# Patient Record
Sex: Female | Born: 1945 | Hispanic: No | Marital: Married | Smoking: Never smoker
Health system: Southern US, Community
[De-identification: ages and names within clinical notes are randomized; demographics above are authoritative.]

## PROBLEM LIST (undated history)

## (undated) DIAGNOSIS — G709 Myoneural disorder, unspecified: Secondary | ICD-10-CM

## (undated) HISTORY — PX: COSMETIC SURGERY: SHX468

## (undated) HISTORY — DX: Myoneural disorder, unspecified: G70.9

---

## 1999-07-07 ENCOUNTER — Other Ambulatory Visit: Admission: RE | Admit: 1999-07-07 | Discharge: 1999-07-07 | Payer: Self-pay | Admitting: Obstetrics and Gynecology

## 2002-04-01 ENCOUNTER — Other Ambulatory Visit: Admission: RE | Admit: 2002-04-01 | Discharge: 2002-04-01 | Payer: Self-pay | Admitting: Gynecology

## 2005-06-06 ENCOUNTER — Other Ambulatory Visit: Admission: RE | Admit: 2005-06-06 | Discharge: 2005-06-06 | Payer: Self-pay | Admitting: Gynecology

## 2007-07-02 ENCOUNTER — Ambulatory Visit: Payer: Self-pay | Admitting: Family Medicine

## 2007-07-02 LAB — CONVERTED CEMR LAB
ALT: 22 units/L (ref 0–35)
AST: 24 units/L (ref 0–37)
Alkaline Phosphatase: 50 units/L (ref 39–117)
Basophils Absolute: 0 10*3/uL (ref 0.0–0.1)
Basophils Relative: 1 % (ref 0–1)
CO2: 27 meq/L (ref 19–32)
Eosinophils Absolute: 0.1 10*3/uL (ref 0.0–0.7)
Eosinophils Relative: 2 % (ref 0–5)
HCT: 40.5 % (ref 36.0–46.0)
Helicobacter Pylori Antibody-IgG: 0.7
MCHC: 32.3 g/dL (ref 30.0–36.0)
MCV: 78 fL (ref 78.0–100.0)
Monocytes Relative: 8 % (ref 3–11)
RBC: 5.19 M/uL — ABNORMAL HIGH (ref 3.87–5.11)
Sodium: 142 meq/L (ref 135–145)
Total Bilirubin: 0.6 mg/dL (ref 0.3–1.2)
Total Protein: 7.5 g/dL (ref 6.0–8.3)
WBC: 6.9 10*3/uL (ref 4.0–10.5)

## 2007-07-03 ENCOUNTER — Ambulatory Visit: Payer: Self-pay | Admitting: *Deleted

## 2007-07-08 ENCOUNTER — Ambulatory Visit (HOSPITAL_COMMUNITY): Admission: RE | Admit: 2007-07-08 | Discharge: 2007-07-08 | Payer: Self-pay | Admitting: Family Medicine

## 2007-08-12 ENCOUNTER — Ambulatory Visit: Payer: Self-pay | Admitting: Family Medicine

## 2007-09-19 ENCOUNTER — Ambulatory Visit: Payer: Self-pay | Admitting: Family Medicine

## 2008-01-02 ENCOUNTER — Ambulatory Visit: Payer: Self-pay | Admitting: Internal Medicine

## 2008-05-13 ENCOUNTER — Ambulatory Visit: Payer: Self-pay | Admitting: Family Medicine

## 2008-06-02 ENCOUNTER — Ambulatory Visit: Payer: Self-pay | Admitting: Internal Medicine

## 2008-06-11 ENCOUNTER — Encounter (INDEPENDENT_AMBULATORY_CARE_PROVIDER_SITE_OTHER): Payer: Self-pay | Admitting: Family Medicine

## 2008-06-11 ENCOUNTER — Ambulatory Visit: Payer: Self-pay | Admitting: Family Medicine

## 2008-06-11 LAB — CONVERTED CEMR LAB
BUN: 14 mg/dL (ref 6–23)
Basophils Relative: 0 % (ref 0–1)
Chloride: 105 meq/L (ref 96–112)
Eosinophils Absolute: 0.1 10*3/uL (ref 0.0–0.7)
Eosinophils Relative: 1 % (ref 0–5)
Free T4: 1.3 ng/dL (ref 0.89–1.80)
HCT: 38.1 % (ref 36.0–46.0)
Lymphocytes Relative: 31 % (ref 12–46)
MCV: 75.6 fL — ABNORMAL LOW (ref 78.0–100.0)
Neutro Abs: 4.1 10*3/uL (ref 1.7–7.7)
Platelets: 247 10*3/uL (ref 150–400)
Potassium: 4.3 meq/L (ref 3.5–5.3)
Sodium: 141 meq/L (ref 135–145)
WBC: 6.9 10*3/uL (ref 4.0–10.5)

## 2008-06-19 ENCOUNTER — Ambulatory Visit (HOSPITAL_COMMUNITY): Admission: RE | Admit: 2008-06-19 | Discharge: 2008-06-19 | Payer: Self-pay | Admitting: Family Medicine

## 2008-07-10 ENCOUNTER — Ambulatory Visit: Payer: Self-pay | Admitting: Family Medicine

## 2008-10-14 ENCOUNTER — Ambulatory Visit: Payer: Self-pay | Admitting: Family Medicine

## 2008-10-14 LAB — CONVERTED CEMR LAB: Helicobacter Pylori Antibody-IgG: 0.6

## 2008-11-17 ENCOUNTER — Ambulatory Visit: Payer: Self-pay | Admitting: Family Medicine

## 2009-02-12 ENCOUNTER — Ambulatory Visit: Payer: Self-pay | Admitting: Family Medicine

## 2009-02-23 ENCOUNTER — Ambulatory Visit (HOSPITAL_COMMUNITY): Admission: RE | Admit: 2009-02-23 | Discharge: 2009-02-23 | Payer: Self-pay | Admitting: Family Medicine

## 2009-04-13 ENCOUNTER — Ambulatory Visit: Payer: Self-pay | Admitting: Family Medicine

## 2009-10-01 ENCOUNTER — Ambulatory Visit: Payer: Self-pay | Admitting: Family Medicine

## 2009-10-12 ENCOUNTER — Ambulatory Visit (HOSPITAL_COMMUNITY): Admission: RE | Admit: 2009-10-12 | Discharge: 2009-10-12 | Payer: Self-pay | Admitting: Family Medicine

## 2010-03-22 ENCOUNTER — Ambulatory Visit: Payer: Self-pay | Admitting: Family Medicine

## 2010-04-04 ENCOUNTER — Ambulatory Visit (HOSPITAL_COMMUNITY): Admission: RE | Admit: 2010-04-04 | Discharge: 2010-04-04 | Payer: Self-pay | Admitting: Family Medicine

## 2010-11-11 ENCOUNTER — Other Ambulatory Visit (HOSPITAL_COMMUNITY): Payer: Self-pay | Admitting: Family Medicine

## 2010-11-11 DIAGNOSIS — Z1231 Encounter for screening mammogram for malignant neoplasm of breast: Secondary | ICD-10-CM

## 2010-11-18 ENCOUNTER — Ambulatory Visit (HOSPITAL_COMMUNITY): Payer: Self-pay

## 2010-11-21 ENCOUNTER — Ambulatory Visit (HOSPITAL_COMMUNITY)
Admission: RE | Admit: 2010-11-21 | Discharge: 2010-11-21 | Disposition: A | Discharge status: Home / Self Care | Payer: Self-pay | Source: Ambulatory Visit | Attending: Family Medicine | Admitting: Family Medicine

## 2010-11-21 DIAGNOSIS — Z1231 Encounter for screening mammogram for malignant neoplasm of breast: Secondary | ICD-10-CM | POA: Insufficient documentation

## 2011-12-22 DIAGNOSIS — E039 Hypothyroidism, unspecified: Secondary | ICD-10-CM | POA: Diagnosis not present

## 2011-12-22 DIAGNOSIS — M47812 Spondylosis without myelopathy or radiculopathy, cervical region: Secondary | ICD-10-CM | POA: Diagnosis not present

## 2011-12-22 DIAGNOSIS — R059 Cough, unspecified: Secondary | ICD-10-CM | POA: Diagnosis not present

## 2011-12-22 DIAGNOSIS — M129 Arthropathy, unspecified: Secondary | ICD-10-CM | POA: Diagnosis not present

## 2011-12-22 DIAGNOSIS — M542 Cervicalgia: Secondary | ICD-10-CM | POA: Diagnosis not present

## 2011-12-22 DIAGNOSIS — R1084 Generalized abdominal pain: Secondary | ICD-10-CM | POA: Diagnosis not present

## 2011-12-22 DIAGNOSIS — N951 Menopausal and female climacteric states: Secondary | ICD-10-CM | POA: Diagnosis not present

## 2013-04-29 ENCOUNTER — Ambulatory Visit (INDEPENDENT_AMBULATORY_CARE_PROVIDER_SITE_OTHER): Payer: Medicare Other | Admitting: Family Medicine

## 2013-04-29 ENCOUNTER — Ambulatory Visit: Payer: Medicare Other

## 2013-04-29 VITALS — BP 100/60 | HR 70 | Temp 97.8°F | Resp 18 | Ht 60.0 in | Wt 99.0 lb

## 2013-04-29 DIAGNOSIS — R5381 Other malaise: Secondary | ICD-10-CM

## 2013-04-29 DIAGNOSIS — Z789 Other specified health status: Secondary | ICD-10-CM

## 2013-04-29 DIAGNOSIS — K219 Gastro-esophageal reflux disease without esophagitis: Secondary | ICD-10-CM

## 2013-04-29 DIAGNOSIS — R079 Chest pain, unspecified: Secondary | ICD-10-CM

## 2013-04-29 DIAGNOSIS — G8929 Other chronic pain: Secondary | ICD-10-CM

## 2013-04-29 DIAGNOSIS — I499 Cardiac arrhythmia, unspecified: Secondary | ICD-10-CM

## 2013-04-29 DIAGNOSIS — R1013 Epigastric pain: Secondary | ICD-10-CM

## 2013-04-29 LAB — POCT CBC
HCT, POC: 39.4 % (ref 37.7–47.9)
Lymph, poc: 2 (ref 0.6–3.4)
MCH, POC: 25.4 pg — AB (ref 27–31.2)
MCHC: 31.5 g/dL — AB (ref 31.8–35.4)
MCV: 80.8 fL (ref 80–97)
MPV: 8.7 fL (ref 0–99.8)
POC LYMPH PERCENT: 29.3 %L (ref 10–50)
Platelet Count, POC: 223 10*3/uL (ref 142–424)

## 2013-04-29 MED ORDER — OMEPRAZOLE 20 MG PO CPDR: 20.0000 mg | DELAYED_RELEASE_CAPSULE | Freq: Every day | ORAL | Status: DC

## 2013-04-29 MED ORDER — SUCRALFATE 1 G PO TABS: 1.0000 g | ORAL_TABLET | Freq: Four times a day (QID) | ORAL | Status: DC

## 2013-04-29 NOTE — Patient Instructions (Addendum)
Use the carafate and prilosec as directed for heartburn/ stomach pain.   We are going to refer you to cardiology for further evaluation of your heart.   You will need to bring someone who speaks English to your cardiology appointment please.

## 2013-04-29 NOTE — Progress Notes (Addendum)
[facility] [address], [place] [address] [phone]  Date:  04/29/2013   Name:  Diana Mercer   DOB:  [birth year]   MRN:  [id number]  PCP:  No primary provider on file.    Chief Complaint: Abdominal Pain and Chest Pain   History of Present Illness:  Diana Mercer is a 67 y.o. very pleasant female patient who presents with the following:  Here as a new patient today.  She does have a doctor at [facility] [place].  She was seen there recently and received a refill of her hormone patch.  There is a significant language barrier with the pt and her husband; I am not able to communicate clearly.   Spoke with her daughter Diana Mercer over the phone and she interpreted between [place].  Pt has complaint of feeling tired, she "yawns a lot" sometimes.   She has compliant of stomach pain on and off.  Her pain will wax and wane, sometimes go away totally.  This has gone on for "many years."   No nausea or vomiting. She states she feels SOB when she is anxious or startled.  Gives an example of if someone came up behind her suddenly she would feel surprised and SOB.  States she does not actually have CP, but sometimes will feel hot and uncomfortable.  She also sometimes has burning in her stomach- ?like GERD.  Denies any current CP.    She has been seen by a few different doctors at unknown locations and tried a few different medications (also unknown) for her abdominal issues.  Her daughter states that "she cannot take most medicines, they are too strong for her" but is not sure of any specific adverse reactions in the past.    Noted frequent PVCs on her EKG.  She gives a vague history of occasional palpitations which are not bothersome to her.  Cannot state how often or for how long she has noted palpitations.     There are no active problems to display for this patient.   Past Medical History  Diagnosis Date  . Neuromuscular disorder     Past Surgical  History  Procedure Laterality Date  . Cosmetic surgery      History  Substance Use Topics  . Smoking status: Never Smoker   . Smokeless tobacco: Not on file  . Alcohol Use: No    History reviewed. No pertinent family history.  Allergies  Allergen Reactions  . Tylenol [Acetaminophen]     No reaction pt states that she feels it is not good for her    Medication list has been reviewed and updated.  No current outpatient prescriptions on file prior to visit.   No current facility-administered medications on file prior to visit.    Review of Systems:  As per HPI- otherwise negative.   Physical Examination: Filed Vitals:   04/29/13 1037  BP: 100/60  Pulse: 70  Temp: 97.8 F (36.6 C)  Resp: 18   Filed Vitals:   04/29/13 1037  Height: 5' (1.524 m)  Weight: 99 lb (44.906 kg)   Body mass index is 19.33 kg/(m^2). Ideal Body Weight: Weight in (lb) to have BMI = 25: 127.7  GEN: WDWN, NAD, Non-toxic, A & O x 3, slim build HEENT: Atraumatic, Normocephalic. Neck supple. No masses, No LAD. Ears and Nose: No external deformity. CV: RRR with early beats, No M/G/R. No JVD. No thrill. No extra heart sounds. PULM: CTA B, no  wheezes, crackles, rhonchi. No retractions. No resp. distress. No accessory muscle use. ABD: S, NT, ND, +BS. No rebound. No HSM. EXTR: No c/c/e NEURO Normal gait.  PSYCH: Normally interactive. Conversant. Not depressed or anxious appearing.  Calm demeanor.   EKG:  Frequent PVCs.   Down going T in V2- otherwise no ST abnormality.    [facility] reading (PRIMARY) by  Dr. Patsy Lager. Abdominal series:  Non- obstructive, non- specific bowel gas pattern.  Negative AP chest  ACUTE ABDOMEN SERIES (ABDOMEN 2 VIEW & CHEST 1 VIEW)  Comparison: October 12, 2009, February 23, 2009  Findings: there is asymmetric opacity identified in the lateral left apex. There is no pulmonary edema or pleural effusion. There is no bowel obstruction or free air. Bowel content is  noted throughout colon. There is scoliosis of spine.  IMPRESSION: No bowel obstruction. Constipation. Asymmetric opacity identified in the lateral left apex. If the patient has prior chest x-ray for comparison comparison would be helpful to establish stability. If no prior chest x-rays are available, recommend further evaluation with chest CT.  Clinically significant discrepancy from primary report, if provided: None  Results for orders placed in visit on 04/29/13  POCT CBC      Result Value Range   WBC 6.9  4.6 - 10.2 K/uL   Lymph, poc 2.0  0.6 - 3.4   POC LYMPH PERCENT 29.3  10 - 50 %L   MID (cbc) 0.5  0 - 0.9   POC MID % 6.7  0 - 12 %M   POC Granulocyte 4.4  2 - 6.9   Granulocyte percent 64.0  37 - 80 %G   RBC 4.88  4.04 - 5.48 M/uL   Hemoglobin 12.4  12.2 - 16.2 g/dL   HCT, POC 40.9  81.1 - 47.9 %   MCV 80.8  80 - 97 fL   MCH, POC 25.4 (*) 27 - 31.2 pg   MCHC 31.5 (*) 31.8 - 35.4 g/dL   RDW, POC 91.4     Platelet Count, POC 223  142 - 424 K/uL   MPV 8.7  0 - 99.8 fL    Assessment and Plan: Language barrier  Other malaise and fatigue  Abdominal pain, chronic, epigastric - Plan: POCT CBC, Comprehensive metabolic panel, DG Abd Acute W/Chest  Chest pain - Plan: EKG 12-Lead, DG Abd Acute W/Chest  Irregular heart beat - Plan: Ambulatory referral to Cardiology, TSH  GERD (gastroesophageal reflux disease) - Plan: sucralfate (CARAFATE) 1 G tablet, omeprazole (PRILOSEC) 20 MG capsule  Diana Mercer is here today with history of several years of abdominal pain, and also fatigue.  Communicated with her by speaking with her daughter over the phone.  This required extra time for our visit.  GERD is the most likely diagnosis.  Will start her on carafate and prilosec while we await her CMP.    She does have frequent PVCs but no acute changes on today's EKG.  Denies CP.  Will refer to cardiology- have instructed her that it would be best if she can bring a family member who speaks  English to this appt.    If anything changes or gets worse she is to seek care.    Signed Abbe Amsterdam, MD  Received her CXR report.  Will follow- up with the rest of her labs.   8/27- called but was not able to communicate with person who answered phone.  Will send letter to pt- see copy of letter.  Also hand wrote "Please call  me to talk about your chest x-ray" on her letter.

## 2013-04-30 ENCOUNTER — Encounter: Payer: Self-pay | Admitting: Family Medicine

## 2013-04-30 LAB — COMPREHENSIVE METABOLIC PANEL
Albumin: 4.4 g/dL (ref 3.5–5.2)
Alkaline Phosphatase: 56 U/L (ref 39–117)
Calcium: 9.3 mg/dL (ref 8.4–10.5)

## 2013-04-30 LAB — TSH: TSH: 1.342 u[IU]/mL (ref 0.350–4.500)

## 2013-05-20 ENCOUNTER — Other Ambulatory Visit: Payer: Self-pay | Admitting: Family Medicine

## 2013-05-20 DIAGNOSIS — K219 Gastro-esophageal reflux disease without esophagitis: Secondary | ICD-10-CM

## 2013-05-21 NOTE — Telephone Encounter (Signed)
Dr Patsy Lager, do you want to keep Rxing this for pt to take regularly?

## 2013-09-19 ENCOUNTER — Ambulatory Visit (INDEPENDENT_AMBULATORY_CARE_PROVIDER_SITE_OTHER): Payer: Medicare Other | Admitting: Emergency Medicine

## 2013-09-19 VITALS — BP 126/60 | HR 72 | Temp 98.1°F | Resp 16 | Ht 60.0 in | Wt 99.0 lb

## 2013-09-19 DIAGNOSIS — M5430 Sciatica, unspecified side: Secondary | ICD-10-CM

## 2013-09-19 MED ORDER — TRAMADOL HCL 50 MG PO TABS: 50.0000 mg | ORAL_TABLET | Freq: Three times a day (TID) | ORAL | Status: DC | PRN

## 2013-09-19 NOTE — Patient Instructions (Signed)
Sciatica °Sciatica is pain, weakness, numbness, or tingling along the path of the sciatic nerve. The nerve starts in the lower back and runs down the back of each leg. The nerve controls the muscles in the lower leg and in the back of the knee, while also providing sensation to the back of the thigh, lower leg, and the sole of your foot. Sciatica is a symptom of another medical condition. For instance, nerve damage or certain conditions, such as a herniated disk or bone spur on the spine, pinch or put pressure on the sciatic nerve. This causes the pain, weakness, or other sensations normally associated with sciatica. Generally, sciatica only affects one side of the body. °CAUSES  °· Herniated or slipped disc. °· Degenerative disk disease. °· A pain disorder involving the narrow muscle in the buttocks (piriformis syndrome). °· Pelvic injury or fracture. °· Pregnancy. °· Tumor (rare). °SYMPTOMS  °Symptoms can vary from mild to very severe. The symptoms usually travel from the low back to the buttocks and down the back of the leg. Symptoms can include: °· Mild tingling or dull aches in the lower back, leg, or hip. °· Numbness in the back of the calf or sole of the foot. °· Burning sensations in the lower back, leg, or hip. °· Sharp pains in the lower back, leg, or hip. °· Leg weakness. °· Severe back pain inhibiting movement. °These symptoms may get worse with coughing, sneezing, laughing, or prolonged sitting or standing. Also, being overweight may worsen symptoms. °DIAGNOSIS  °Your caregiver will perform a physical exam to look for common symptoms of sciatica. He or she may ask you to do certain movements or activities that would trigger sciatic nerve pain. Other tests may be performed to find the cause of the sciatica. These may include: °· Blood tests. °· X-rays. °· Imaging tests, such as an MRI or CT scan. °TREATMENT  °Treatment is directed at the cause of the sciatic pain. Sometimes, treatment is not necessary  and the pain and discomfort goes away on its own. If treatment is needed, your caregiver may suggest: °· Over-the-counter medicines to relieve pain. °· Prescription medicines, such as anti-inflammatory medicine, muscle relaxants, or narcotics. °· Applying heat or ice to the painful area. °· Steroid injections to lessen pain, irritation, and inflammation around the nerve. °· Reducing activity during periods of pain. °· Exercising and stretching to strengthen your abdomen and improve flexibility of your spine. Your caregiver may suggest losing weight if the extra weight makes the back pain worse. °· Physical therapy. °· Surgery to eliminate what is pressing or pinching the nerve, such as a bone spur or part of a herniated disk. °HOME CARE INSTRUCTIONS  °· Only take over-the-counter or prescription medicines for pain or discomfort as directed by your caregiver. °· Apply ice to the affected area for 20 minutes, 3 4 times a day for the first 48 72 hours. Then try heat in the same way. °· Exercise, stretch, or perform your usual activities if these do not aggravate your pain. °· Attend physical therapy sessions as directed by your caregiver. °· Keep all follow-up appointments as directed by your caregiver. °· Do not wear high heels or shoes that do not provide proper support. °· Check your mattress to see if it is too soft. A firm mattress may lessen your pain and discomfort. °SEEK IMMEDIATE MEDICAL CARE IF:  °· You lose control of your bowel or bladder (incontinence). °· You have increasing weakness in the lower back,   pelvis, buttocks, or legs. °· You have redness or swelling of your back. °· You have a burning sensation when you urinate. °· You have pain that gets worse when you lie down or awakens you at night. °· Your pain is worse than you have experienced in the past. °· Your pain is lasting longer than 4 weeks. °· You are suddenly losing weight without reason. °MAKE SURE YOU: °· Understand these  instructions. °· Will watch your condition. °· Will get help right away if you are not doing well or get worse. °Document Released: 08/15/2001 Document Revised: 02/20/2012 Document Reviewed: 12/31/2011 °ExitCare® Patient Information ©2014 [employer]. ° °

## 2013-09-19 NOTE — Progress Notes (Signed)
[facility] [address], [place] [id number] [phone]  Date:  09/19/2013   Name:  Diana Mercer   DOB:  02/08/1946   MRN:  [id number]  PCP:  No primary provider on file.    Chief Complaint: Leg Pain   History of Present Illness:  Diana Mercer is a 68 y.o. very pleasant female patient who presents with the following:  Brought by husband who is Nurse, learning disabilitytranslator.  She has pain in her left lateral mid calf for over one month.  No history of injury or overuse.  Has associated pain in left buttock down her left leg to mid calf that is intermittent.  No neuro complaint or weakness.  No back pain.  No overuse.  No improvement with over the counter medications or other home remedies. Denies other complaint or health concern today. Says her stomach pains have resolved.   Patient Active Problem List   Diagnosis Date Noted  . Language barrier 04/29/2013    Past Medical History  Diagnosis Date  . Neuromuscular disorder     Past Surgical History  Procedure Laterality Date  . Cosmetic surgery      History  Substance Use Topics  . Smoking status: Never Smoker   . Smokeless tobacco: Not on file  . Alcohol Use: No    History reviewed. No pertinent family history.  Allergies  Allergen Reactions  . Tylenol [Acetaminophen]     No reaction pt states that she feels it is not good for her    Medication list has been reviewed and updated.  Current Outpatient Prescriptions on File Prior to Visit  Medication Sig Dispense Refill  . estradiol (CLIMARA - DOSED IN MG/24 HR) 0.1 mg/24hr patch Place 1 patch onto the skin once a week.      Marland Kitchen. omeprazole (PRILOSEC) 20 MG capsule Take 1 capsule (20 mg total) by mouth daily.  30 capsule  3  . sucralfate (CARAFATE) 1 G tablet Take 1 tablet 4x daily as needed.  PLEASE call [facility] to discuss recent chest- x-ray  40 tablet  0   No current facility-administered medications on file prior to visit.    Review of  Systems:  As per HPI, otherwise negative.    Physical Examination: Filed Vitals:   09/19/13 1037  BP: 126/60  Pulse: 72  Temp: 98.1 F (36.7 C)  Resp: 16   Filed Vitals:   09/19/13 1037  Height: 5' (1.524 m)  Weight: 99 lb (44.906 kg)   Body mass index is 19.33 kg/(m^2). Ideal Body Weight: Weight in (lb) to have BMI = 25: 127.7  GEN: WDWN, NAD, Non-toxic, A & O x 3 HEENT: Atraumatic, Normocephalic. Neck supple. No masses, No LAD. Ears and Nose: No external deformity. CV: RRR, No M/G/R. No JVD. No thrill. No extra heart sounds. PULM: CTA B, no wheezes, crackles, rhonchi. No retractions. No resp. distress. No accessory muscle use. ABD: S, NT, ND, +BS. No rebound. No HSM. EXTR: No c/c/e  No tenderness or ecchymosis in left calf. NEURO Normal gait.  SLR positive on left.  DTR's symmetrical. PSYCH: Normally interactive. Conversant. Not depressed or anxious appearing.  Calm demeanor.  BACK:  Negative not tender.   Assessment and Plan: Sciatic neuritis  Signed,  Phillips OdorJeffery Zacharias Ridling, MD

## 2013-12-03 ENCOUNTER — Ambulatory Visit (INDEPENDENT_AMBULATORY_CARE_PROVIDER_SITE_OTHER): Payer: Medicare Other | Admitting: Emergency Medicine

## 2013-12-03 ENCOUNTER — Ambulatory Visit: Payer: Self-pay

## 2013-12-03 ENCOUNTER — Ambulatory Visit: Payer: Medicare Other

## 2013-12-03 VITALS — BP 118/68 | HR 68 | Temp 98.0°F | Resp 17 | Ht 60.5 in | Wt 100.0 lb

## 2013-12-03 DIAGNOSIS — M549 Dorsalgia, unspecified: Secondary | ICD-10-CM

## 2013-12-03 DIAGNOSIS — M25579 Pain in unspecified ankle and joints of unspecified foot: Secondary | ICD-10-CM | POA: Diagnosis not present

## 2013-12-03 DIAGNOSIS — M79609 Pain in unspecified limb: Secondary | ICD-10-CM | POA: Diagnosis not present

## 2013-12-03 MED ORDER — TRAMADOL HCL 50 MG PO TABS
50.0000 mg | ORAL_TABLET | Freq: Three times a day (TID) | ORAL | Status: DC | PRN
Start: 2013-12-03 — End: 2015-10-19

## 2013-12-03 NOTE — Progress Notes (Signed)
   Subjective:    Patient ID: Diana Mercer, female    DOB: 11/10/1945, 68 y.o.   MRN: [id number]  HPI  68 year old [facility]  Was in our office 09/19/13 and saw Dr Dareen PianoAnderson who diagnosed her with sciatica neuritis.   She was to follow up in 2 weeks if no better for recheck and xrays.  She did not return for recheck until today.   Mild improvement since last OV Still experiencing pain and numbness down left leg from buttock to ankle   Review of Systems     Objective:   Physical Exam patient is alert and cooperative she does not appear in any distress. There is mild tenderness L5-S1 on the left. Straight leg raising is negative on the left to 80. I could not elicit any focal weakness in the lower extremities. Motor strength was 5 out of 5  [facility] reading (PRIMARY) by  Dr. Cleta Albertsaub there are degenerative changes L2-L3 and L5-S1. She does have moderate osteoporosis Films of the left ankle and tibia fibula are normal     Assessment & Plan:  Patient has persistent back pain. I do think she would benefit from physical therapy. We'll go ahead and make an orthopedic referral. She was given a refill on her Ultram to take for pain. I personally performed the services described in this documentation, which was scribed in my presence. The recorded information has been reviewed and is accurate.

## 2014-01-22 ENCOUNTER — Ambulatory Visit (INDEPENDENT_AMBULATORY_CARE_PROVIDER_SITE_OTHER): Payer: Medicare Other | Admitting: Internal Medicine

## 2014-01-22 VITALS — BP 100/62 | HR 74 | Temp 97.7°F | Resp 18 | Ht 60.0 in | Wt 101.0 lb

## 2014-01-22 DIAGNOSIS — L255 Unspecified contact dermatitis due to plants, except food: Secondary | ICD-10-CM | POA: Diagnosis not present

## 2014-01-22 DIAGNOSIS — L237 Allergic contact dermatitis due to plants, except food: Secondary | ICD-10-CM

## 2014-01-22 DIAGNOSIS — R21 Rash and other nonspecific skin eruption: Secondary | ICD-10-CM | POA: Diagnosis not present

## 2014-01-22 DIAGNOSIS — L299 Pruritus, unspecified: Secondary | ICD-10-CM

## 2014-01-22 MED ORDER — TRIAMCINOLONE ACETONIDE 0.1 % EX CREA: 1.0000 "application " | TOPICAL_CREAM | Freq: Two times a day (BID) | CUTANEOUS | Status: AC

## 2014-01-22 MED ORDER — PREDNISONE 10 MG PO TABS: ORAL_TABLET | ORAL | Status: DC

## 2014-01-22 NOTE — Patient Instructions (Signed)
Poison Ivy Poison ivy is a inflammation of the skin (contact dermatitis) caused by touching the allergens on the leaves of the ivy plant following previous exposure to the plant. The rash usually appears 48 hours after exposure. The rash is usually bumps (papules) or blisters (vesicles) in a linear pattern. Depending on your own sensitivity, the rash may simply cause redness and itching, or it may also progress to blisters which may break open. These must be well cared for to prevent secondary bacterial (germ) infection, followed by scarring. Keep any open areas dry, clean, dressed, and covered with an antibacterial ointment if needed. The eyes may also get puffy. The puffiness is worst in the morning and gets better as the day progresses. This dermatitis usually heals without scarring, within 2 to 3 weeks without treatment. HOME CARE INSTRUCTIONS  Thoroughly wash with soap and water as soon as you have been exposed to poison ivy. You have about one half hour to remove the plant resin before it will cause the rash. This washing will destroy the oil or antigen on the skin that is causing, or will cause, the rash. Be sure to wash under your fingernails as any plant resin there will continue to spread the rash. Do not rub skin vigorously when washing affected area. Poison ivy cannot spread if no oil from the plant remains on your body. A rash that has progressed to weeping sores will not spread the rash unless you have not washed thoroughly. It is also important to wash any clothes you have been wearing as these may carry active allergens. The rash will return if you wear the unwashed clothing, even several days later. Avoidance of the plant in the future is the best measure. Poison ivy plant can be recognized by the number of leaves. Generally, poison ivy has three leaves with flowering branches on a single stem. Diphenhydramine may be purchased over the counter and used as needed for itching. Do not drive with  this medication if it makes you drowsy.Ask your caregiver about medication for children. SEEK MEDICAL CARE IF:  Open sores develop.  Redness spreads beyond area of rash.  You notice purulent (pus-like) discharge.  You have increased pain.  Other signs of infection develop (such as fever). Document Released: 08/18/2000 Document Revised: 11/13/2011 Document Reviewed: 07/07/2009 ExitCare Patient Information 2014 [employer].  

## 2014-01-22 NOTE — Progress Notes (Signed)
   Subjective:    Patient ID: Diana Mercer, female    DOB: 10/04/1945, 68 y.o.   MRN: [id number]  HPI  Right eye edema, itching began last night Felt sticky this morning upon awaking. Denies fever, runny nose. No ear pain or facial pressure.  Face and orbital area red and itchy, no pain, no infection Language barrier Review of Systems     Objective:   Physical Exam  Vitals reviewed. Constitutional: She is oriented to person, place, and time. She appears well-developed and well-nourished. No distress.  HENT:  Head: Normocephalic.    Poison ivy rash  Eyes: Conjunctivae, EOM and lids are normal. Pupils are equal, round, and reactive to light. Lids are everted and swept, no foreign bodies found. Right eye exhibits chemosis. Right eye exhibits no discharge. No foreign body present in the right eye. Left eye exhibits no chemosis and no discharge. No foreign body present in the left eye. No scleral icterus.    Neck: Normal range of motion. Neck supple.  Pulmonary/Chest: Effort normal.  Musculoskeletal: Normal range of motion.  Lymphadenopathy:    She has no cervical adenopathy.  Neurological: She is alert and oriented to person, place, and time. No cranial nerve deficit. She exhibits normal muscle tone. Coordination normal.  Skin: Skin is intact. Rash noted. Rash is maculopapular. There is erythema.     Psychiatric: She has a normal mood and affect. Her behavior is normal.          Assessment & Plan:  Poison Ivy face Triamcinolone cream/Prednisone taper

## 2015-06-16 IMAGING — CR DG LUMBAR SPINE COMPLETE 4+V
5 series · 5 of 5 positions shown · non-contrast
Comparison: DG LUMBAR SPINE COMPLETE dated 10/12/2009

CLINICAL DATA: Back pain.

EXAM:
LUMBAR SPINE - COMPLETE 4+ VIEW

[AP]
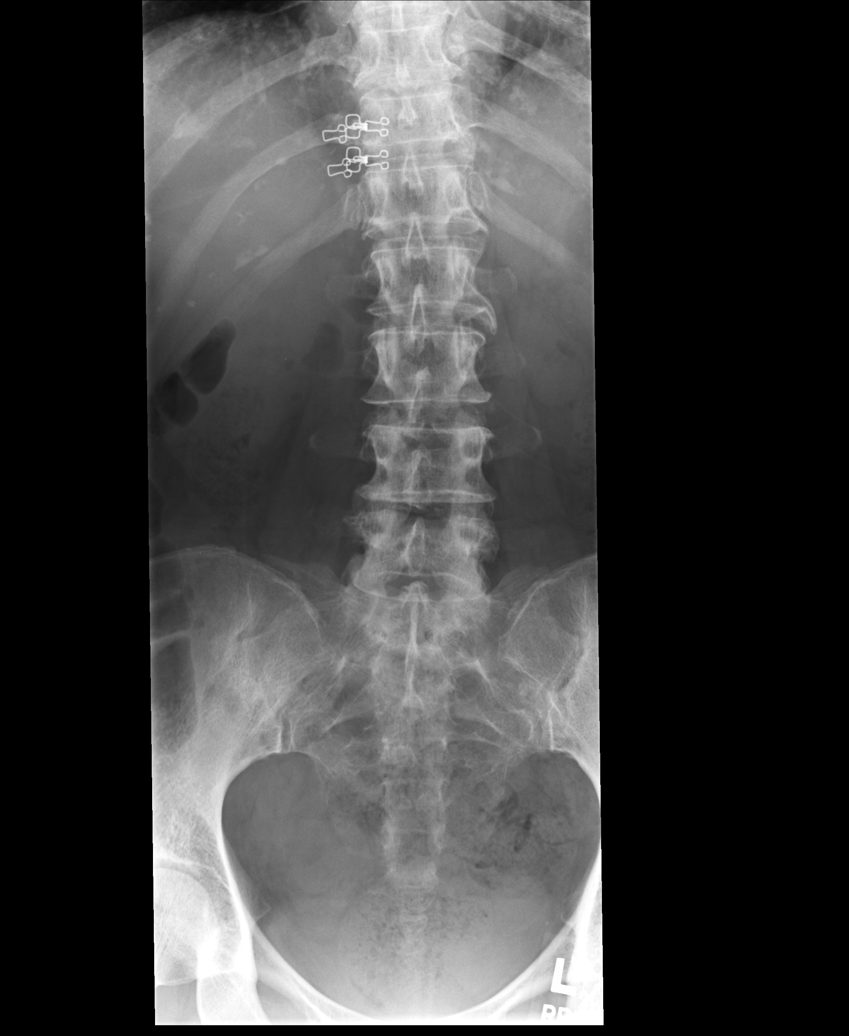

[rpo]
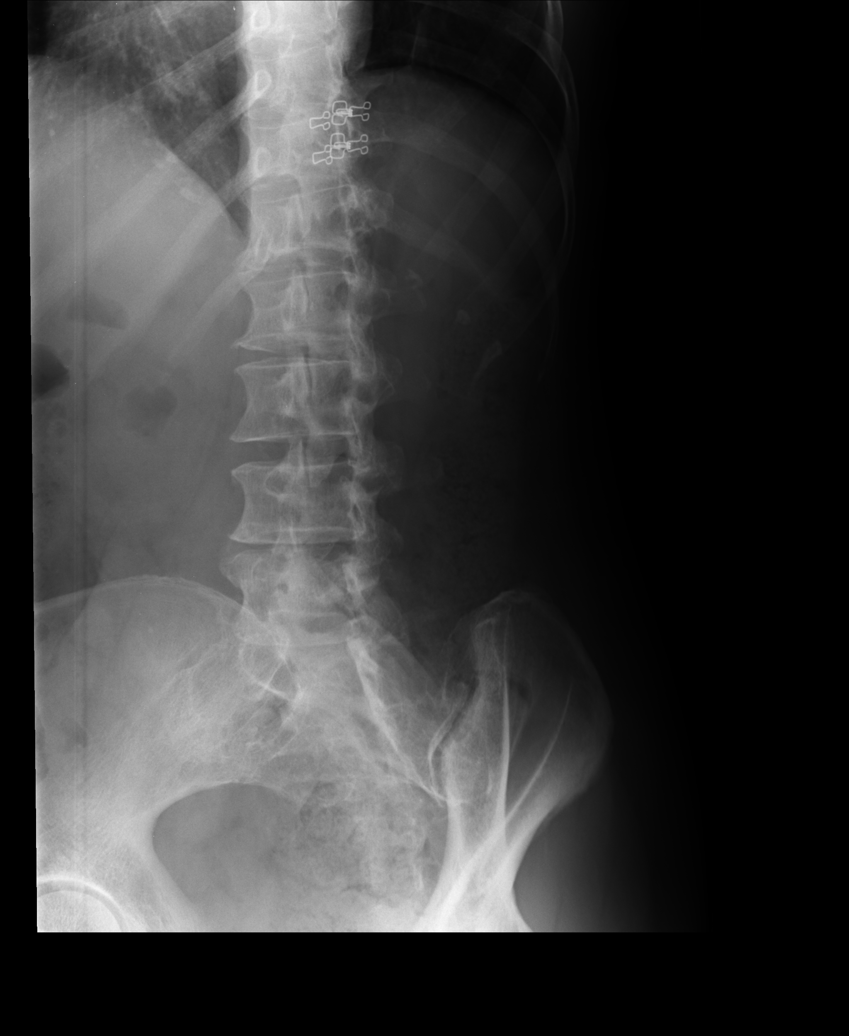

[lpo]
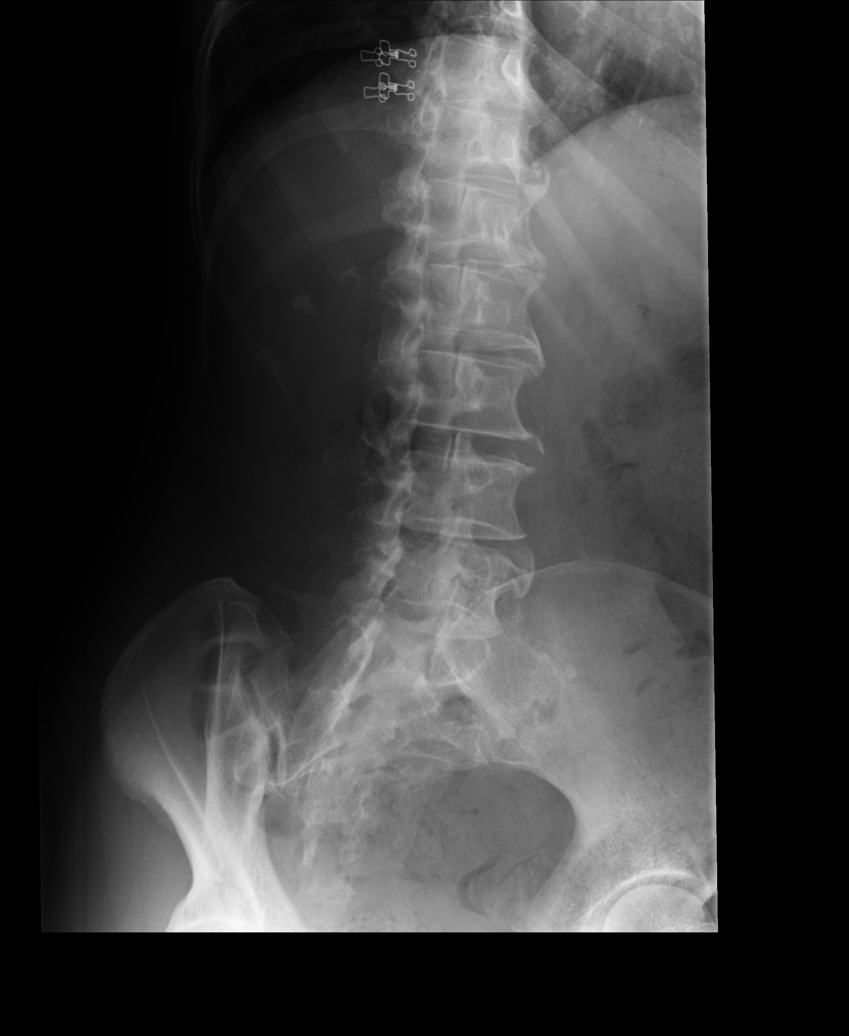

[lateral]
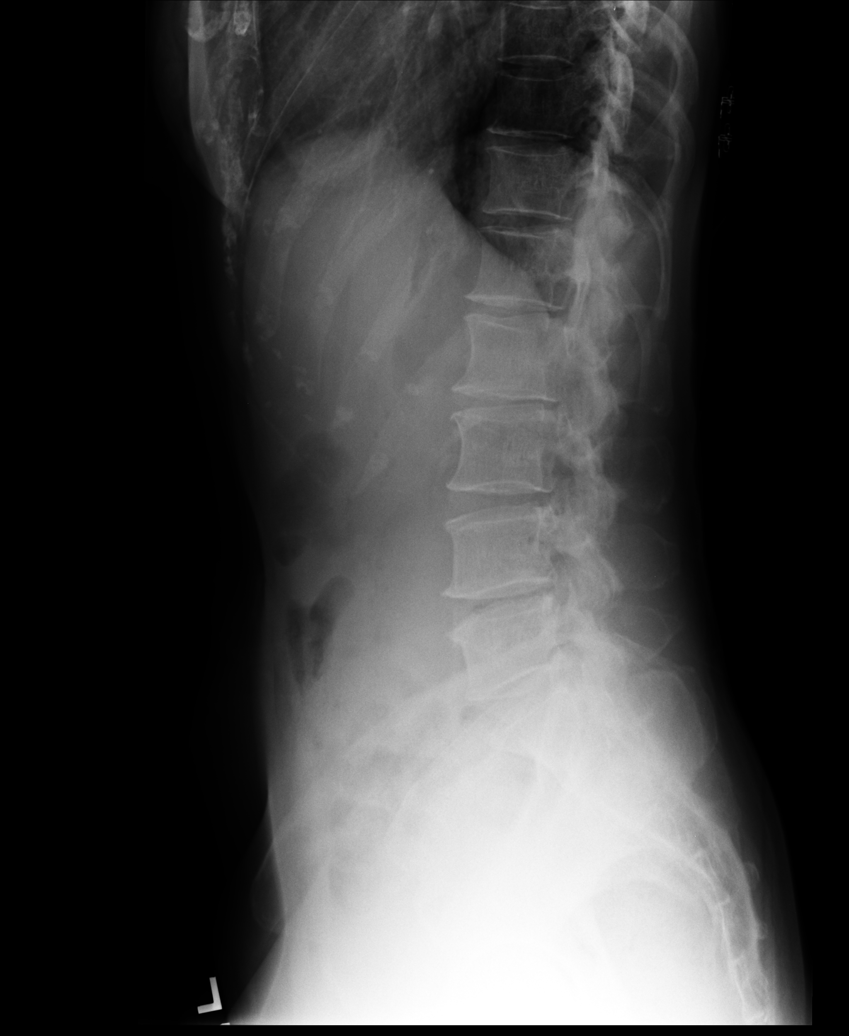

[l5 s1]
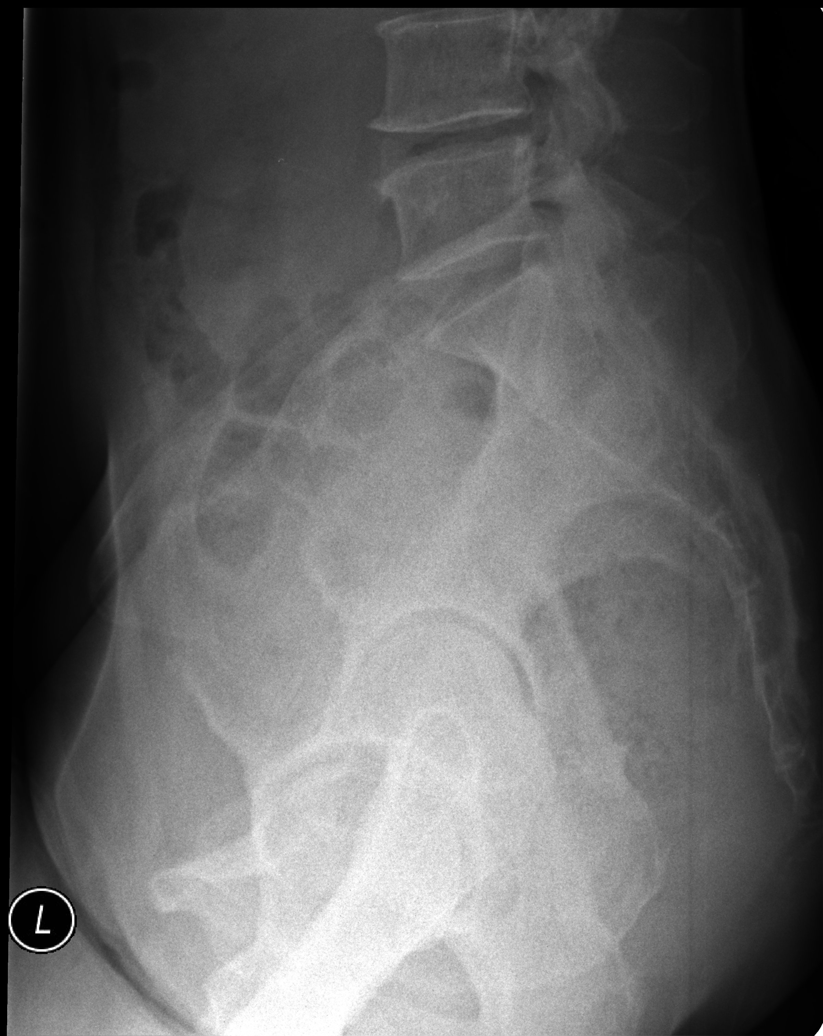

[5 of 5 positions shown; findings below may reference images not displayed]

FINDINGS: Five lumbar type vertebral bodies. Lumbosacral transitional anatomy
is present. For non-rib-bearing lumbar type vertebral bodies are
present. Lower lumbar facet arthrosis. Degenerative disease is most
pronounced at L1-L2 and L3-L4. Lumbosacral junction appears within
normal limits.
IMPRESSION: 1. Lumbosacral transitional anatomy with 4 lumbar type vertebral
bodies.
2. Mild to moderate lumbar spondylosis, most prominent at L1-L2 and
L3-L4.
3. There is lumbosacral transitional anatomy. This report assumes
that there are 5 lumbar type vertebral bodies. Recommend close
correlation with radiographs if intervention is elected.

## 2015-10-11 DIAGNOSIS — J069 Acute upper respiratory infection, unspecified: Secondary | ICD-10-CM | POA: Diagnosis not present

## 2015-10-11 DIAGNOSIS — M5432 Sciatica, left side: Secondary | ICD-10-CM | POA: Diagnosis not present

## 2015-10-19 ENCOUNTER — Ambulatory Visit (INDEPENDENT_AMBULATORY_CARE_PROVIDER_SITE_OTHER): Payer: Medicare Other | Admitting: Family Medicine

## 2015-10-19 VITALS — BP 133/72 | HR 76 | Temp 97.9°F | Resp 16 | Ht 61.0 in | Wt 104.8 lb

## 2015-10-19 DIAGNOSIS — M5442 Lumbago with sciatica, left side: Secondary | ICD-10-CM | POA: Diagnosis not present

## 2015-10-19 MED ORDER — TRAMADOL HCL 50 MG PO TABS: 50.0000 mg | ORAL_TABLET | Freq: Three times a day (TID) | ORAL | Status: AC | PRN

## 2015-10-19 MED ORDER — PREDNISONE 20 MG PO TABS: ORAL_TABLET | ORAL | Status: AC

## 2015-10-19 NOTE — Progress Notes (Signed)
This is a 70 y.o.female who complains of left back pain radiating down posterior left leg.  Character of pain: aching Location of pain:  As above Radiation of pain:  As above Onset associated with:  Nothing.  Has a two week hx of mild pain Patient has a past history of low back pain for which resolved with prednisone in the mpast  [facility] denies any urinary symptoms, bowel problems, numbness in the legs, loss of motor power. Halynn Drumheller had no fever.  SKhammanh Jetter has tried nothing.  Past Medical History  Diagnosis Date  . Neuromuscular disorder [facility])      Past Surgical History  Procedure Laterality Date  . Cosmetic surgery      Objective:  middle-aged female in no acute distress. Blood pressure 133/72, pulse 76, temperature 97.9 F (36.6 C), temperature source Oral, resp. rate 16, height  (1.549 m), weight 104 lb 12.8 oz (47.537 kg), SpO2 98 %.Body mass index is 19.81 kg/(m^2). Palpation of the back reveals no pain CVA:  nontender Abdomen: soft nontendr Peripheral pulses:  DP/PT present Inspection of the back: Reveals no scoliosis Straight-leg raising: positive at 90 degrees on left only Motor exam of lower extremity: No abnormal weakness. Reflexes: Symmetric and normal Skin exam: no rash  Assessment/Plan: Acute lower back pain without acute neurological findings.    ICD-9-CM ICD-10-CM   1. Left-sided low back pain with left-sided sciatica 724.3 M54.42 traMADol (ULTRAM) 50 MG tablet     predniSONE (DELTASONE) 20 MG tablet   Elvina Sidle, MD

## 2015-10-19 NOTE — Patient Instructions (Signed)
Sciatica Sciatica is pain, weakness, numbness, or tingling along the path of the sciatic nerve. The nerve starts in the lower back and runs down the back of each leg. The nerve controls the muscles in the lower leg and in the back of the knee, while also providing sensation to the back of the thigh, lower leg, and the sole of your foot. Sciatica is a symptom of another medical condition. For instance, nerve damage or certain conditions, such as a herniated disk or bone spur on the spine, pinch or put pressure on the sciatic nerve. This causes the pain, weakness, or other sensations normally associated with sciatica. Generally, sciatica only affects one side of the body. CAUSES   Herniated or slipped disc.  Degenerative disk disease.  A pain disorder involving the narrow muscle in the buttocks (piriformis syndrome).  Pelvic injury or fracture.  Pregnancy.  Tumor (rare). SYMPTOMS  Symptoms can vary from mild to very severe. The symptoms usually travel from the low back to the buttocks and down the back of the leg. Symptoms can include:  Mild tingling or dull aches in the lower back, leg, or hip.  Numbness in the back of the calf or sole of the foot.  Burning sensations in the lower back, leg, or hip.  Sharp pains in the lower back, leg, or hip.  Leg weakness.  Severe back pain inhibiting movement. These symptoms may get worse with coughing, sneezing, laughing, or prolonged sitting or standing. Also, being overweight may worsen symptoms. DIAGNOSIS  Your caregiver will perform a physical exam to look for common symptoms of sciatica. He or she may ask you to do certain movements or activities that would trigger sciatic nerve pain. Other tests may be performed to find the cause of the sciatica. These may include:  Blood tests.  X-rays.  Imaging tests, such as an MRI or CT scan. TREATMENT  Treatment is directed at the cause of the sciatic pain. Sometimes, treatment is not necessary  and the pain and discomfort goes away on its own. If treatment is needed, your caregiver may suggest:  Over-the-counter medicines to relieve pain.  Prescription medicines, such as anti-inflammatory medicine, muscle relaxants, or narcotics.  Applying heat or ice to the painful area.  Steroid injections to lessen pain, irritation, and inflammation around the nerve.  Reducing activity during periods of pain.  Exercising and stretching to strengthen your abdomen and improve flexibility of your spine. Your caregiver may suggest losing weight if the extra weight makes the back pain worse.  Physical therapy.  Surgery to eliminate what is pressing or pinching the nerve, such as a bone spur or part of a herniated disk. HOME CARE INSTRUCTIONS   Only take over-the-counter or prescription medicines for pain or discomfort as directed by your caregiver.  Apply ice to the affected area for 20 minutes, 3-4 times a day for the first 48-72 hours. Then try heat in the same way.  Exercise, stretch, or perform your usual activities if these do not aggravate your pain.  Attend physical therapy sessions as directed by your caregiver.  Keep all follow-up appointments as directed by your caregiver.  Do not wear high heels or shoes that do not provide proper support.  Check your mattress to see if it is too soft. A firm mattress may lessen your pain and discomfort. SEEK IMMEDIATE MEDICAL CARE IF:   You lose control of your bowel or bladder (incontinence).  You have increasing weakness in the lower back, pelvis, buttocks,   or legs.  You have redness or swelling of your back.  You have a burning sensation when you urinate.  You have pain that gets worse when you lie down or awakens you at night.  Your pain is worse than you have experienced in the past.  Your pain is lasting longer than 4 weeks.  You are suddenly losing weight without reason. MAKE SURE YOU:  Understand these  instructions.  Will watch your condition.  Will get help right away if you are not doing well or get worse.   This information is not intended to replace advice given to you by your health care provider. Make sure you discuss any questions you have with your health care provider.   Document Released: 08/15/2001 Document Revised: 05/12/2015 Document Reviewed: 12/31/2011 Elsevier Interactive Patient Education [employer] ?au Th?n Kinh T?a (Sciatica) ?au th?n kinh t?a l ?au, y?u, t ho?c ?au bu?t d?c theo ???ng ?i c?a dy th?n kinh hng. Dy th?n kinh b?t ??u ? th?t l?ng v ch?y xu?ng pha m?t sau m?i chn. Dy th?n kinh ny ?i?u khi?n cc c? ? c?ng chn v ? m?t sau c?a ??u g?i, trong khi c?ng cho c?m gic ? m?t sau c?a ?i, c?ng chn v lng bn chn. ?au th?n kinh t?a l tri?u ch?ng c?a m?t ch?ng b?nh khc. V d?, t?n th??ng dy th?n kinh ho?c m?t s? tnh tr?ng c? th?, ch?ng h?n nh? thot v? ??a ??m ho?c m? x??ng trn c?t s?ng, k?p ho?c t ? ln dy th?n kinh hng. ?i?u ny gy ?au, y?u ho?c nh?ng c?m gic khc, th??ng k?t h?p v?i ?au th?n kinh t?a. Ni chung, ?au th?n kinh t?a ch? ?nh h??ng ??n m?t bn c?a c? th?.  NGUYN NHN  Thot v? ho?c tr??t ??a ??m.  B?nh thoi ha ??a ??m.  R?i lo?n ?au lin quan ??n c? h?p ? mng (h?i ch?ng piriformis).  Ch?n th??ng ho?c gy x??ng ch?u.  [place].  Kh?i u (hi?m). TRI?U CH?NG Cc tri?u ch?ng c th? khc nhau t? nh? ??n r?t n?ng. Cc tri?u ch?ng th??ng ?i t? th?t l?ng xu?ng mng v xu?ng m?t sau c?a chn. Cc tri?u ch?ng c th? bao g?m:  ?au bu?t nh? ho?c ?au m ? ? th?t l?ng, chn ho?c hng.  T ? m?t sau c?a b?p chn ho?c gan bn chn.  C?m gic nng rt ? th?t l?ng, chn ho?c hng.  ?au nhi ? th?t l?ng, chn ho?c hng.  Y?u chn.  ?au l?ng nghim tr?ng gy kh kh?n cho vi?c ?i l?i. Cc tri?u ch?ng ny c th? t?i t? h?n khi c ho, h?t h?i, c??i ho?c ng?i ho?c ??ng lu. Ngoi ra, bo ph c th? lm tr?m tr?ng thm cc  tri?u ch?ng. CH?N ?ON Chuyn gia ch?m [place] s?c kh?e s? khm th?c th? ?? tm cc tri?u ch?ng ph? bi?n c?a ?au th?n kinh t?a. Chuyn gia ch?m [place] s?c kh?e c th? yu c?u b?n th?c hi?n nh?ng ??ng tc ho?c ho?t ??ng nh?t ??nh gy ?au dy th?n kinh hng. Cc ki?m tra khc c th? ???c th?c hi?n ?? tm ra nguyn nhn ?au th?n kinh t?a. Cc bi t?p ny c th? bao g?m:  Xt nghi?m mu.  Ch?p X quang.  Ki?m tra hnh ?nh, ch?ng h?n nh? MRI ho?c ch?p CT. ?I?U TR? ?i?u tr? nh?m vo nguyn nhn gy ?au th?n kinh t?a. ?i khi, khng c?n ?i?u tr?, c?n ?au v c?m gic kh ch?u s? t?  h?t. N?u c?n ?i?u tr?, chuyn gia ch?m [place] s?c kh?e c th? ?? ngh?:  Thu?c gi?m ?au khng c?n k toa.  Thu?c k toa, ch?ng h?n nh? thu?c khng vim, gin c? ho?c thu?c gi?m ?au gy ng?.  Ch??m nng ho?c ? l?nh vo ch? ?au.  Tim steroid ?? gi?m ?au, gi?m kch thch v gi?m vim quanh dy th?n kinh.  Gi?m ho?t ??ng trong th?i gian ?au.  T?p th? d?c v ko gin ?? t?ng s?c b?n ? b?ng v c?i thi?n ?? m?m d?o c?a c?t s?ng. Chuyn gia ch?m [place] s?c kh?e c th? ?? ngh? gi?m cn n?u th?a cn khi?n ?au l?ng t?i t? h?n.  V?t l tr? li?u.  Ph?u thu?t ?? lo?i b? nh?ng g ?ang gy s?c p ho?c k?p dy th?n kinh, ch?ng h?n nh? m? x??ng ho?c m?t ph?n c?a thot v? ??a ??m. H??NG D?N CH?M [place] T?I NH  Ch? s? d?ng thu?c khng c?n k toa ho?c thu?c c?n k toa ?? gi?m ?au ho?c gi?m c?m gic kh ch?u theo ch? d?n c?a chuyn gia ch?m [place] s?c kh?e c?a b?n.  Ch??m ? vo khu v?c b? ?nh h??ng trong 20 pht, 3-4 l?n m?i ngy trong 48-72 gi? ??u tin. Sau ? th? ch??m nng theo cng cch.  T?p th? d?c, ko gin ho?c th?c hi?n cc ho?t ??ng thng th??ng c?a b?n n?u nh?ng ho?t ??ng ny khng lm tr?m tr?ng thm c?n ?au c?a b?n.  Tham d? cc bu?i v?t l tr? li?u theo ch? d?n c?a chuyn gia ch?m [place] s?c kh?e.  Tun th? m?i cu?c h?n khm l?i theo ch? d?n c?a chuyn gia ch?m [place] s?c kh?e.  Khng mang giy cao gt ho?c giy khng h? tr? thch  h?p.  Ki?m tra n?m c?a b?n xem n c qu m?m khng. N?m c?ng ch?c c th? gi?m ?au v gi?m c?m gic kh ch?u c?a b?n. HY NGAY L?P T?C ?I KHM N?U:  B?n ?i ngoi ho?c ?i ti?u m?t ki?m sot (khng t? ch?).  B?n b? y?u gia t?ng ? th?t l?ng, khung ch?u, mng v chn.  B?n c b? t?y ?? ho?c s?ng ? l?ng.  B?n c c?m gic nng rt khi ?i ti?u.  B?n b? ?au n?ng h?n khi n?m xu?ng ho?c khi t?nh gi?c vo ban ?m.  C?n ?au t?i t? h?n so v?i c? ?au b?n b? trong qu kh?.  C?n ?au ko di h?n 4 tu?n.  B?n ??t nhin gi?m cn m khng c l do. ??M B?O B?N:  Hi?u cc h??ng d?n ny.  S? theo di tnh tr?ng c?a mnh.  S? yu c?u tr? gip ngay l?p t?c n?u b?n c?m th?y khng ?? ho?c tnh tr?ng tr?m tr?ng h?n.   Thng tin ny khng nh?m m?c ?ch thay th? cho l?i khuyn m chuyn gia ch?m [place] s?c kh?e ni v?i qu v?. Hy b?o ??m qu v? ph?i th?o lu?n b?t k? v?n ?? g m qu v? c v?i chuyn gia ch?m [place] s?c kh?e c?a qu v?.   Document Released: 02/20/2012 Document Revised: 05/12/2015 Elsevier Interactive Patient Education [employer]

## 2015-11-13 DIAGNOSIS — R05 Cough: Secondary | ICD-10-CM | POA: Diagnosis not present

## 2015-12-02 DIAGNOSIS — E785 Hyperlipidemia, unspecified: Secondary | ICD-10-CM | POA: Diagnosis not present

## 2015-12-02 DIAGNOSIS — R05 Cough: Secondary | ICD-10-CM | POA: Diagnosis not present

## 2015-12-02 DIAGNOSIS — R5383 Other fatigue: Secondary | ICD-10-CM | POA: Diagnosis not present

## 2015-12-02 DIAGNOSIS — Z136 Encounter for screening for cardiovascular disorders: Secondary | ICD-10-CM | POA: Diagnosis not present

## 2015-12-02 DIAGNOSIS — Z0000 Encounter for general adult medical examination without abnormal findings: Secondary | ICD-10-CM | POA: Diagnosis not present

## 2015-12-02 DIAGNOSIS — Z135 Encounter for screening for eye and ear disorders: Secondary | ICD-10-CM | POA: Diagnosis not present

## 2015-12-02 DIAGNOSIS — M25552 Pain in left hip: Secondary | ICD-10-CM | POA: Diagnosis not present

## 2015-12-02 DIAGNOSIS — Z13820 Encounter for screening for osteoporosis: Secondary | ICD-10-CM | POA: Diagnosis not present

## 2015-12-02 DIAGNOSIS — Z1239 Encounter for other screening for malignant neoplasm of breast: Secondary | ICD-10-CM | POA: Diagnosis not present

## 2015-12-02 DIAGNOSIS — Z1211 Encounter for screening for malignant neoplasm of colon: Secondary | ICD-10-CM | POA: Diagnosis not present

## 2015-12-02 DIAGNOSIS — M791 Myalgia: Secondary | ICD-10-CM | POA: Diagnosis not present

## 2015-12-02 DIAGNOSIS — Z13220 Encounter for screening for lipoid disorders: Secondary | ICD-10-CM | POA: Diagnosis not present

## 2015-12-02 DIAGNOSIS — E559 Vitamin D deficiency, unspecified: Secondary | ICD-10-CM | POA: Diagnosis not present

## 2015-12-02 DIAGNOSIS — K219 Gastro-esophageal reflux disease without esophagitis: Secondary | ICD-10-CM | POA: Diagnosis not present

## 2015-12-03 DIAGNOSIS — K219 Gastro-esophageal reflux disease without esophagitis: Secondary | ICD-10-CM | POA: Diagnosis not present

## 2015-12-03 DIAGNOSIS — Z13220 Encounter for screening for lipoid disorders: Secondary | ICD-10-CM | POA: Diagnosis not present

## 2015-12-03 DIAGNOSIS — M25552 Pain in left hip: Secondary | ICD-10-CM | POA: Diagnosis not present

## 2015-12-03 DIAGNOSIS — M791 Myalgia: Secondary | ICD-10-CM | POA: Diagnosis not present

## 2015-12-03 DIAGNOSIS — R918 Other nonspecific abnormal finding of lung field: Secondary | ICD-10-CM | POA: Diagnosis not present

## 2015-12-03 DIAGNOSIS — Z136 Encounter for screening for cardiovascular disorders: Secondary | ICD-10-CM | POA: Diagnosis not present

## 2015-12-03 DIAGNOSIS — E559 Vitamin D deficiency, unspecified: Secondary | ICD-10-CM | POA: Diagnosis not present

## 2015-12-03 DIAGNOSIS — R5383 Other fatigue: Secondary | ICD-10-CM | POA: Diagnosis not present

## 2015-12-03 DIAGNOSIS — E785 Hyperlipidemia, unspecified: Secondary | ICD-10-CM | POA: Diagnosis not present

## 2015-12-03 DIAGNOSIS — R509 Fever, unspecified: Secondary | ICD-10-CM | POA: Diagnosis not present

## 2015-12-03 DIAGNOSIS — M1612 Unilateral primary osteoarthritis, left hip: Secondary | ICD-10-CM | POA: Diagnosis not present

## 2015-12-03 DIAGNOSIS — R05 Cough: Secondary | ICD-10-CM | POA: Diagnosis not present

## 2015-12-06 DIAGNOSIS — J479 Bronchiectasis, uncomplicated: Secondary | ICD-10-CM | POA: Diagnosis not present

## 2015-12-06 DIAGNOSIS — R05 Cough: Secondary | ICD-10-CM | POA: Diagnosis not present

## 2015-12-06 DIAGNOSIS — R918 Other nonspecific abnormal finding of lung field: Secondary | ICD-10-CM | POA: Diagnosis not present

## 2015-12-06 DIAGNOSIS — J984 Other disorders of lung: Secondary | ICD-10-CM | POA: Diagnosis not present

## 2015-12-13 DIAGNOSIS — R05 Cough: Secondary | ICD-10-CM | POA: Diagnosis not present

## 2015-12-13 DIAGNOSIS — E559 Vitamin D deficiency, unspecified: Secondary | ICD-10-CM | POA: Diagnosis not present

## 2015-12-13 DIAGNOSIS — D649 Anemia, unspecified: Secondary | ICD-10-CM | POA: Diagnosis not present

## 2015-12-13 DIAGNOSIS — K219 Gastro-esophageal reflux disease without esophagitis: Secondary | ICD-10-CM | POA: Diagnosis not present

## 2015-12-13 DIAGNOSIS — D509 Iron deficiency anemia, unspecified: Secondary | ICD-10-CM | POA: Diagnosis not present

## 2015-12-13 DIAGNOSIS — M25552 Pain in left hip: Secondary | ICD-10-CM | POA: Diagnosis not present

## 2015-12-13 DIAGNOSIS — R938 Abnormal findings on diagnostic imaging of other specified body structures: Secondary | ICD-10-CM | POA: Diagnosis not present

## 2015-12-21 DIAGNOSIS — R05 Cough: Secondary | ICD-10-CM | POA: Diagnosis not present

## 2015-12-21 DIAGNOSIS — R938 Abnormal findings on diagnostic imaging of other specified body structures: Secondary | ICD-10-CM | POA: Diagnosis not present

## 2015-12-21 DIAGNOSIS — K219 Gastro-esophageal reflux disease without esophagitis: Secondary | ICD-10-CM | POA: Diagnosis not present

## 2015-12-21 DIAGNOSIS — D649 Anemia, unspecified: Secondary | ICD-10-CM | POA: Diagnosis not present

## 2015-12-21 DIAGNOSIS — J9819 Other pulmonary collapse: Secondary | ICD-10-CM | POA: Diagnosis not present

## 2015-12-27 DIAGNOSIS — Z135 Encounter for screening for eye and ear disorders: Secondary | ICD-10-CM | POA: Diagnosis not present

## 2015-12-27 DIAGNOSIS — H2513 Age-related nuclear cataract, bilateral: Secondary | ICD-10-CM | POA: Diagnosis not present

## 2015-12-27 DIAGNOSIS — H40233 Intermittent angle-closure glaucoma, bilateral: Secondary | ICD-10-CM | POA: Diagnosis not present

## 2016-01-13 DIAGNOSIS — R630 Anorexia: Secondary | ICD-10-CM | POA: Diagnosis not present

## 2016-01-13 DIAGNOSIS — J189 Pneumonia, unspecified organism: Secondary | ICD-10-CM | POA: Diagnosis not present

## 2016-01-13 DIAGNOSIS — L905 Scar conditions and fibrosis of skin: Secondary | ICD-10-CM | POA: Diagnosis not present

## 2016-01-13 DIAGNOSIS — R05 Cough: Secondary | ICD-10-CM | POA: Diagnosis not present

## 2016-01-13 DIAGNOSIS — R739 Hyperglycemia, unspecified: Secondary | ICD-10-CM | POA: Diagnosis not present

## 2016-01-13 DIAGNOSIS — J471 Bronchiectasis with (acute) exacerbation: Secondary | ICD-10-CM | POA: Diagnosis not present

## 2016-01-13 DIAGNOSIS — K219 Gastro-esophageal reflux disease without esophagitis: Secondary | ICD-10-CM | POA: Diagnosis not present

## 2016-01-13 DIAGNOSIS — J479 Bronchiectasis, uncomplicated: Secondary | ICD-10-CM | POA: Diagnosis not present

## 2016-01-13 DIAGNOSIS — R042 Hemoptysis: Secondary | ICD-10-CM | POA: Diagnosis not present

## 2016-01-13 DIAGNOSIS — J984 Other disorders of lung: Secondary | ICD-10-CM | POA: Diagnosis not present

## 2016-01-13 DIAGNOSIS — R7611 Nonspecific reaction to tuberculin skin test without active tuberculosis: Secondary | ICD-10-CM | POA: Diagnosis not present

## 2016-01-14 DIAGNOSIS — R938 Abnormal findings on diagnostic imaging of other specified body structures: Secondary | ICD-10-CM | POA: Diagnosis not present

## 2016-01-14 DIAGNOSIS — R7612 Nonspecific reaction to cell mediated immunity measurement of gamma interferon antigen response without active tuberculosis: Secondary | ICD-10-CM | POA: Diagnosis not present

## 2016-01-14 DIAGNOSIS — R042 Hemoptysis: Secondary | ICD-10-CM | POA: Diagnosis present

## 2016-01-14 DIAGNOSIS — Z886 Allergy status to analgesic agent status: Secondary | ICD-10-CM | POA: Diagnosis not present

## 2016-01-14 DIAGNOSIS — R634 Abnormal weight loss: Secondary | ICD-10-CM | POA: Diagnosis not present

## 2016-01-14 DIAGNOSIS — R7611 Nonspecific reaction to tuberculin skin test without active tuberculosis: Secondary | ICD-10-CM | POA: Diagnosis present

## 2016-01-14 DIAGNOSIS — J984 Other disorders of lung: Secondary | ICD-10-CM | POA: Diagnosis present

## 2016-01-14 DIAGNOSIS — J479 Bronchiectasis, uncomplicated: Secondary | ICD-10-CM | POA: Diagnosis not present

## 2016-01-14 DIAGNOSIS — K219 Gastro-esophageal reflux disease without esophagitis: Secondary | ICD-10-CM | POA: Diagnosis present

## 2016-01-14 DIAGNOSIS — J471 Bronchiectasis with (acute) exacerbation: Secondary | ICD-10-CM | POA: Diagnosis present

## 2016-01-14 DIAGNOSIS — J9819 Other pulmonary collapse: Secondary | ICD-10-CM | POA: Diagnosis not present

## 2016-01-14 DIAGNOSIS — J439 Emphysema, unspecified: Secondary | ICD-10-CM | POA: Diagnosis not present

## 2016-01-14 DIAGNOSIS — R739 Hyperglycemia, unspecified: Secondary | ICD-10-CM | POA: Diagnosis present

## 2016-01-24 DIAGNOSIS — M5442 Lumbago with sciatica, left side: Secondary | ICD-10-CM | POA: Diagnosis not present

## 2016-01-24 DIAGNOSIS — M47816 Spondylosis without myelopathy or radiculopathy, lumbar region: Secondary | ICD-10-CM | POA: Diagnosis not present

## 2016-01-24 DIAGNOSIS — M545 Low back pain: Secondary | ICD-10-CM | POA: Diagnosis not present

## 2016-01-27 DIAGNOSIS — R7611 Nonspecific reaction to tuberculin skin test without active tuberculosis: Secondary | ICD-10-CM | POA: Diagnosis not present

## 2016-01-27 DIAGNOSIS — J471 Bronchiectasis with (acute) exacerbation: Secondary | ICD-10-CM | POA: Diagnosis not present

## 2016-01-27 DIAGNOSIS — R042 Hemoptysis: Secondary | ICD-10-CM | POA: Diagnosis not present

## 2016-03-10 DIAGNOSIS — J471 Bronchiectasis with (acute) exacerbation: Secondary | ICD-10-CM | POA: Diagnosis not present

## 2016-03-10 DIAGNOSIS — R042 Hemoptysis: Secondary | ICD-10-CM | POA: Diagnosis not present

## 2016-03-10 DIAGNOSIS — R05 Cough: Secondary | ICD-10-CM | POA: Diagnosis not present

## 2016-03-10 DIAGNOSIS — R7611 Nonspecific reaction to tuberculin skin test without active tuberculosis: Secondary | ICD-10-CM | POA: Diagnosis not present

## 2016-06-12 DIAGNOSIS — J471 Bronchiectasis with (acute) exacerbation: Secondary | ICD-10-CM | POA: Diagnosis not present

## 2016-06-12 DIAGNOSIS — R05 Cough: Secondary | ICD-10-CM | POA: Diagnosis not present

## 2016-06-12 DIAGNOSIS — R938 Abnormal findings on diagnostic imaging of other specified body structures: Secondary | ICD-10-CM | POA: Diagnosis not present

## 2016-08-18 DIAGNOSIS — M5442 Lumbago with sciatica, left side: Secondary | ICD-10-CM | POA: Diagnosis not present

## 2016-08-18 DIAGNOSIS — M25552 Pain in left hip: Secondary | ICD-10-CM | POA: Diagnosis not present

## 2020-08-16 ENCOUNTER — Other Ambulatory Visit: Payer: Self-pay | Admitting: Family Medicine

## 2020-08-16 DIAGNOSIS — R911 Solitary pulmonary nodule: Secondary | ICD-10-CM

## 2023-05-08 DIAGNOSIS — Z0000 Encounter for general adult medical examination without abnormal findings: Secondary | ICD-10-CM | POA: Diagnosis not present

## 2023-05-08 DIAGNOSIS — Z1331 Encounter for screening for depression: Secondary | ICD-10-CM | POA: Diagnosis not present
